# Patient Record
Sex: Male | Born: 1970 | Race: Black or African American | Hispanic: No | Marital: Single | Smoking: Current every day smoker
Health system: Southern US, Community
[De-identification: ages and names within clinical notes are randomized; demographics above are authoritative.]

## PROBLEM LIST (undated history)

## (undated) DIAGNOSIS — E119 Type 2 diabetes mellitus without complications: Secondary | ICD-10-CM

## (undated) HISTORY — PX: OTHER SURGICAL HISTORY: SHX169

## (undated) HISTORY — PX: BRAIN SURGERY: SHX531

---

## 2013-05-01 ENCOUNTER — Emergency Department (HOSPITAL_COMMUNITY): Payer: No Typology Code available for payment source

## 2013-05-01 ENCOUNTER — Encounter (HOSPITAL_COMMUNITY): Payer: Self-pay | Admitting: Emergency Medicine

## 2013-05-01 ENCOUNTER — Emergency Department (HOSPITAL_COMMUNITY)
Admission: EM | Admit: 2013-05-01 | Discharge: 2013-05-01 | Disposition: A | Discharge status: Home / Self Care | Payer: No Typology Code available for payment source | Attending: Emergency Medicine | Admitting: Emergency Medicine

## 2013-05-01 DIAGNOSIS — S139XXA Sprain of joints and ligaments of unspecified parts of neck, initial encounter: Secondary | ICD-10-CM | POA: Insufficient documentation

## 2013-05-01 DIAGNOSIS — S0990XA Unspecified injury of head, initial encounter: Secondary | ICD-10-CM | POA: Insufficient documentation

## 2013-05-01 DIAGNOSIS — R42 Dizziness and giddiness: Secondary | ICD-10-CM | POA: Insufficient documentation

## 2013-05-01 DIAGNOSIS — S161XXA Strain of muscle, fascia and tendon at neck level, initial encounter: Secondary | ICD-10-CM

## 2013-05-01 DIAGNOSIS — Z9889 Other specified postprocedural states: Secondary | ICD-10-CM | POA: Insufficient documentation

## 2013-05-01 DIAGNOSIS — F17200 Nicotine dependence, unspecified, uncomplicated: Secondary | ICD-10-CM | POA: Insufficient documentation

## 2013-05-01 DIAGNOSIS — M549 Dorsalgia, unspecified: Secondary | ICD-10-CM

## 2013-05-01 DIAGNOSIS — Y9389 Activity, other specified: Secondary | ICD-10-CM | POA: Insufficient documentation

## 2013-05-01 DIAGNOSIS — E119 Type 2 diabetes mellitus without complications: Secondary | ICD-10-CM | POA: Insufficient documentation

## 2013-05-01 DIAGNOSIS — IMO0002 Reserved for concepts with insufficient information to code with codable children: Secondary | ICD-10-CM | POA: Insufficient documentation

## 2013-05-01 DIAGNOSIS — Y92410 Unspecified street and highway as the place of occurrence of the external cause: Secondary | ICD-10-CM | POA: Insufficient documentation

## 2013-05-01 DIAGNOSIS — Z880 Allergy status to penicillin: Secondary | ICD-10-CM | POA: Insufficient documentation

## 2013-05-01 HISTORY — DX: Type 2 diabetes mellitus without complications: E11.9

## 2013-05-01 MED ORDER — OXYCODONE-ACETAMINOPHEN 5-325 MG PO TABS
1.0000 | ORAL_TABLET | Freq: Once | ORAL | Status: AC
  Administered 2013-05-01: 1 via ORAL
  Filled 2013-05-01: qty 1
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
## 2013-05-01 NOTE — ED Notes (Signed)
Patient ambulated in hallways without difficulty or complaints Patient states that he is ready to go home C-Collar removed by MD
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
## 2013-05-02 ENCOUNTER — Emergency Department (HOSPITAL_COMMUNITY)
Admission: EM | Admit: 2013-05-02 | Discharge: 2013-05-02 | Disposition: A | Discharge status: Home / Self Care | Payer: No Typology Code available for payment source | Attending: Emergency Medicine | Admitting: Emergency Medicine

## 2013-05-02 DIAGNOSIS — M542 Cervicalgia: Secondary | ICD-10-CM | POA: Insufficient documentation

## 2013-05-02 DIAGNOSIS — R51 Headache: Secondary | ICD-10-CM | POA: Insufficient documentation

## 2013-05-02 DIAGNOSIS — M545 Low back pain, unspecified: Secondary | ICD-10-CM | POA: Insufficient documentation

## 2013-05-02 DIAGNOSIS — F17200 Nicotine dependence, unspecified, uncomplicated: Secondary | ICD-10-CM | POA: Insufficient documentation

## 2013-05-02 DIAGNOSIS — Z880 Allergy status to penicillin: Secondary | ICD-10-CM | POA: Insufficient documentation

## 2013-05-02 DIAGNOSIS — G8911 Acute pain due to trauma: Secondary | ICD-10-CM | POA: Insufficient documentation

## 2013-05-02 DIAGNOSIS — E119 Type 2 diabetes mellitus without complications: Secondary | ICD-10-CM | POA: Insufficient documentation

## 2013-05-02 MED ORDER — OXYCODONE-ACETAMINOPHEN 5-325 MG PO TABS: 1.0000 | ORAL_TABLET | Freq: Three times a day (TID) | ORAL | Status: DC | PRN
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
## 2013-05-02 NOTE — Progress Notes (Signed)
[facility] provided pt with a list of primary care resources.  °

## 2013-05-03 NOTE — ED Provider Notes (Signed)
Medical screening examination/treatment/procedure(s) were performed by non-physician practitioner and as supervising physician I was immediately available for consultation/collaboration.  EKG Interpretation   None         Gwyneth Sprout, MD 05/03/13 [phone]
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
## 2014-01-12 IMAGING — CT CT CERVICAL SPINE W/O CM
2 of 4 series · 5 of 14 positions shown, 6 images · non-contrast
Comparison: No priors.

CLINICAL DATA: Head and neck pain.

EXAM:
CT HEAD WITHOUT CONTRAST
CT CERVICAL SPINE WITHOUT CONTRAST
TECHNIQUE: Multidetector CT imaging of the head and cervical spine was
performed following the standard protocol without intravenous
contrast. Multiplanar CT image reconstructions of the cervical spine
were also generated.

[Series 3: c-spine st · axial · 0.29mm/px · z∈[+1058,+1114]mm · 2 of 86 slices shown]
[im 29/86  bone]
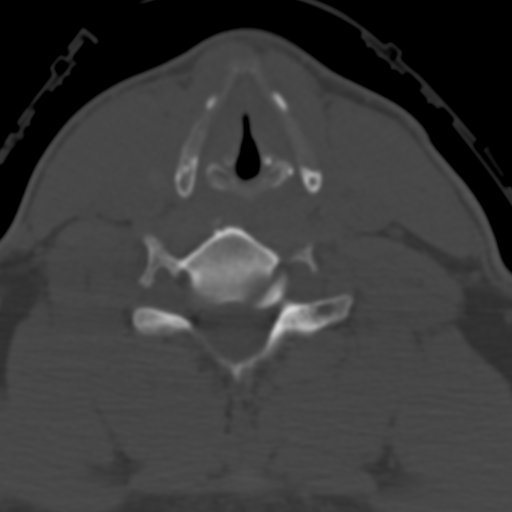
[im 57/86  bone]
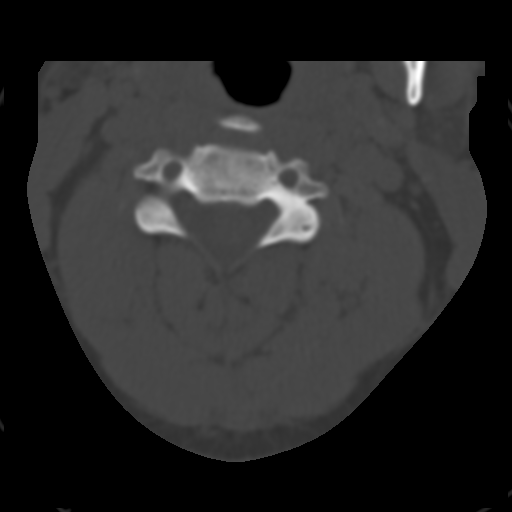

[Series 7: axial · axial · 0.23mm/px · z∈[+1026,+1112]mm · 3 of 90 slices shown, 4 images]
[im 23/90  soft-tissue]
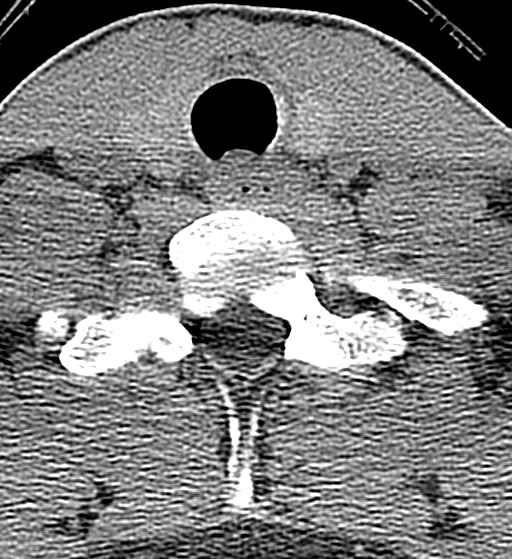
[im 23/90  bone]
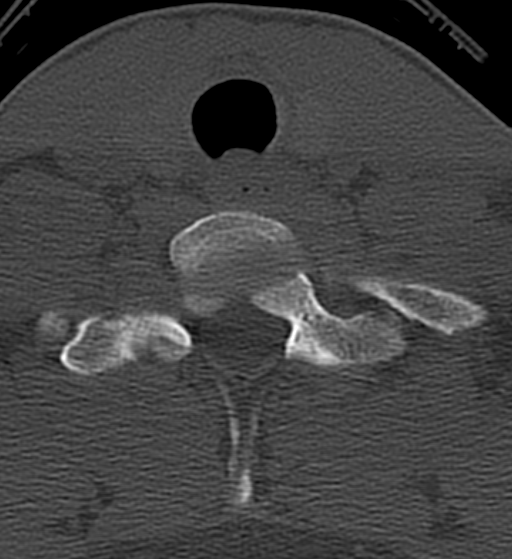
[im 45/90  bone]
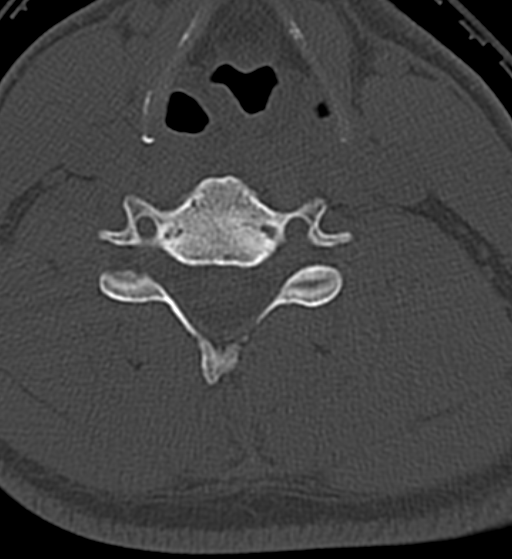
[im 67/90  bone]
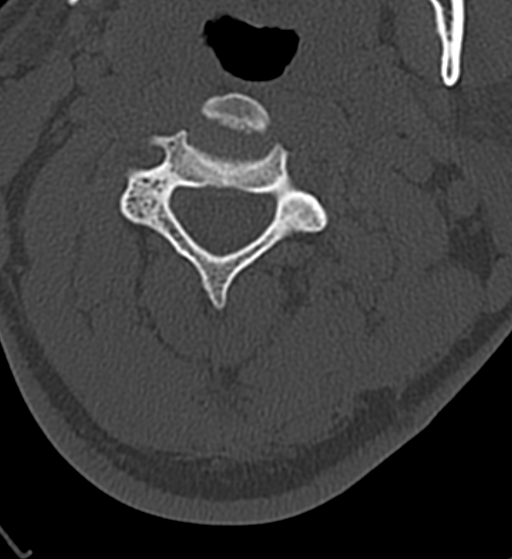

[5 of 14 positions shown; findings below may reference images not displayed]

FINDINGS: CT HEAD FINDINGS

Postoperative changes of left pararenal craniectomy and left
suboccipital craniectomy. Areas of cortical atrophy and underlying
low attenuation are noted in the left frontotemporal region and left
occipital lobe, most compatible with encephalomalacia. No acute
intracranial abnormalities. Specifically, no evidence of acute
intracranial hemorrhage, no definite findings of acute/subacute
cerebral ischemia, no mass, mass effect, hydrocephalus or abnormal
intra or extra-axial fluid collections. Visualized paranasal sinuses
and mastoids are well pneumatized. No acute displaced skull
fractures are identified.

CT CERVICAL SPINE FINDINGS

No acute displaced fractures of the cervical spine. There is
reversal of normal cervical lordosis, centered at the level of C5,
presumably positional. Alignment is otherwise anatomic. Mild
multilevel degenerative disc disease, most pronounced at C5-C6.
Postoperative changes of left suboccipital craniectomy. Visualized
portions of the upper thorax are unremarkable.
IMPRESSION: 1. No acute intracranial abnormalities.
2. Postoperative changes in the head and brain, as above.
3. No evidence of significant acute traumatic injury to the cervical
spine.

## 2014-01-28 ENCOUNTER — Emergency Department (HOSPITAL_COMMUNITY): Payer: Self-pay

## 2014-01-28 ENCOUNTER — Encounter (HOSPITAL_COMMUNITY): Payer: Self-pay | Admitting: Emergency Medicine

## 2014-01-28 ENCOUNTER — Emergency Department (HOSPITAL_COMMUNITY)
Admission: EM | Admit: 2014-01-28 | Discharge: 2014-01-28 | Disposition: A | Discharge status: Home / Self Care | Payer: Self-pay | Attending: Emergency Medicine | Admitting: Emergency Medicine

## 2014-01-28 DIAGNOSIS — S31809A Unspecified open wound of unspecified buttock, initial encounter: Secondary | ICD-10-CM | POA: Insufficient documentation

## 2014-01-28 DIAGNOSIS — W3400XA Accidental discharge from unspecified firearms or gun, initial encounter: Secondary | ICD-10-CM | POA: Insufficient documentation

## 2014-01-28 DIAGNOSIS — E119 Type 2 diabetes mellitus without complications: Secondary | ICD-10-CM | POA: Insufficient documentation

## 2014-01-28 DIAGNOSIS — Y9389 Activity, other specified: Secondary | ICD-10-CM | POA: Insufficient documentation

## 2014-01-28 DIAGNOSIS — Z23 Encounter for immunization: Secondary | ICD-10-CM | POA: Insufficient documentation

## 2014-01-28 DIAGNOSIS — Y9289 Other specified places as the place of occurrence of the external cause: Secondary | ICD-10-CM | POA: Insufficient documentation

## 2014-01-28 DIAGNOSIS — F17200 Nicotine dependence, unspecified, uncomplicated: Secondary | ICD-10-CM | POA: Insufficient documentation

## 2014-01-28 DIAGNOSIS — S31813A Puncture wound without foreign body of right buttock, initial encounter: Secondary | ICD-10-CM

## 2014-01-28 LAB — PREPARE FRESH FROZEN PLASMA
UNIT DIVISION: 0
Unit division: 0

## 2014-01-28 MED ORDER — SODIUM CHLORIDE 0.9 % IJ SOLN
10.0000 mL | INTRAMUSCULAR | Status: DC | PRN
  Administered 2014-01-28: 10 mL via INTRAVENOUS

## 2014-01-28 MED ORDER — MORPHINE SULFATE 2 MG/ML IJ SOLN
INTRAMUSCULAR | Status: AC
  Filled 2014-01-28: qty 2

## 2014-01-28 MED ORDER — OXYCODONE-ACETAMINOPHEN 5-325 MG PO TABS: 1.0000 | ORAL_TABLET | ORAL | Status: DC | PRN

## 2014-01-28 MED ORDER — CLINDAMYCIN HCL 150 MG PO CAPS: 150.0000 mg | ORAL_CAPSULE | Freq: Four times a day (QID) | ORAL | Status: AC

## 2014-01-28 MED ORDER — FENTANYL CITRATE 0.05 MG/ML IJ SOLN
50.0000 ug | Freq: Once | INTRAMUSCULAR | Status: AC
  Administered 2014-01-28: 50 ug via INTRAVENOUS

## 2014-01-28 MED ORDER — FENTANYL CITRATE 0.05 MG/ML IJ SOLN
INTRAMUSCULAR | Status: AC
  Filled 2014-01-28: qty 2

## 2014-01-28 MED ORDER — MORPHINE SULFATE 4 MG/ML IJ SOLN
4.0000 mg | Freq: Once | INTRAMUSCULAR | Status: AC
Start: 2014-01-28 — End: 2014-01-28
  Administered 2014-01-28: 4 mg via INTRAVENOUS
  Filled 2014-01-28: qty 1

## 2014-01-28 MED ORDER — OXYCODONE-ACETAMINOPHEN 5-325 MG PO TABS
1.0000 | ORAL_TABLET | Freq: Once | ORAL | Status: AC
  Administered 2014-01-28: 1 via ORAL
  Filled 2014-01-28: qty 1

## 2014-01-28 MED ORDER — TETANUS-DIPHTH-ACELL PERTUSSIS 5-2.5-18.5 LF-MCG/0.5 IM SUSP
0.5000 mL | Freq: Once | INTRAMUSCULAR | Status: AC
  Administered 2014-01-28: 0.5 mL via INTRAMUSCULAR
  Filled 2014-01-28: qty 0.5

## 2014-01-28 MED ORDER — CLINDAMYCIN PHOSPHATE 600 MG/50ML IV SOLN
600.0000 mg | Freq: Once | INTRAVENOUS | Status: AC
Start: 2014-01-28 — End: 2014-01-28
  Administered 2014-01-28: 600 mg via INTRAVENOUS
  Filled 2014-01-28: qty 50
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
## 2014-01-28 NOTE — ED Notes (Signed)
Spoke with Dr. Bebe ShaggyWickline about giving pt percocet before d/c
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
## 2014-01-28 NOTE — ED Notes (Signed)
Pt ambulated independently. NAD at this time.
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
## 2014-01-28 NOTE — Discharge Instructions (Signed)
Gunshot Wound °Gunshot wounds can cause severe bleeding, damage to soft tissues and vital organs, and broken bones (fractures). They can also lead to infection. The amount of damage depends on the location of the injury, the type of bullet, and how deep the bullet penetrated the body.  °DIAGNOSIS  °A gunshot wound is usually diagnosed by your history and a physical exam. X-rays, an ultrasound exam, or other imaging studies may be done to check for foreign bodies in the wound and to determine the extent of damage. °TREATMENT °Many times, gunshot wounds can be treated by cleaning the wound area and bullet tract and applying a sterile bandage (dressing). Stitches (sutures), skin adhesive strips, or staples may be used to close some wounds. If the injury includes a fracture, a splint may be applied to prevent movement. Antibiotic treatment may be prescribed to help prevent infection. Depending on the gunshot wound and its location, you may require surgery. This is especially true for many bullet injuries to the chest, back, abdomen, and neck. Gunshot wounds to these areas require immediate medical care. °Although there may be lead bullet fragments left in your wound, this will not cause lead poisoning. Bullets or bullet fragments are not removed if they are not causing problems. Removing them could cause more damage to the surrounding tissue. If the bullets or fragments are not very deep, they might work their way closer to the surface of the skin. This might take weeks or even years. Then, they can be removed after applying medicine that numbs the area (local anesthetic). °HOME CARE INSTRUCTIONS  °· Rest the injured body part for the next 2-3 days or as directed by your health care provider. °· If possible, keep the injured area elevated to reduce pain and swelling. °· Keep the area clean and dry. Remove or change any dressings as instructed by your health care provider. °· Only take over-the-counter or prescription  medicines as directed by your health care provider. °· If antibiotics were prescribed, take them as directed. Finish them even if you start to feel better. °· Keep all follow-up appointments. A follow-up exam is usually needed to recheck the injury within 2-3 days. °SEEK IMMEDIATE MEDICAL CARE IF: °· You have shortness of breath. °· You have severe chest or abdominal pain. °· You pass out (faint) or feel as if you may pass out. °· You have uncontrolled bleeding. °· You have chills or a fever. °· You have nausea or vomiting. °· You have redness, swelling, increasing pain, or drainage of pus at the site of the wound. °· You have numbness or weakness in the injured area. This may be a sign of damage to an underlying nerve or tendon. °MAKE SURE YOU:  °· Understand these instructions. °· Will watch your condition. °· Will get help right away if you are not doing well or get worse. °Document Released: 07/24/2004 Document Revised: 04/06/2013 Document Reviewed: 02/21/2013 °ExitCare® Patient Information ©2015 [employer]. This information is not intended to replace advice given to you by your health care provider. Make sure you discuss any questions you have with your health care provider. ° °

## 2014-01-30 ENCOUNTER — Encounter (HOSPITAL_COMMUNITY): Payer: Self-pay | Admitting: Emergency Medicine

## 2014-02-02 ENCOUNTER — Telehealth (HOSPITAL_COMMUNITY): Payer: Self-pay

## 2014-02-03 NOTE — Telephone Encounter (Signed)
Patient wasn't sure he could make appt on 8/12 @2 :45. He will call back if he can make it. Alternatively I told him he could seek care at an urgent care.

## 2014-02-04 ENCOUNTER — Emergency Department (INDEPENDENT_AMBULATORY_CARE_PROVIDER_SITE_OTHER): Payer: Self-pay

## 2014-02-04 ENCOUNTER — Encounter (HOSPITAL_COMMUNITY): Payer: Self-pay | Admitting: Emergency Medicine

## 2014-02-04 ENCOUNTER — Emergency Department (INDEPENDENT_AMBULATORY_CARE_PROVIDER_SITE_OTHER)
Admission: EM | Admit: 2014-02-04 | Discharge: 2014-02-04 | Disposition: A | Discharge status: Home / Self Care | Payer: Self-pay | Source: Home / Self Care | Attending: Family Medicine | Admitting: Family Medicine

## 2014-02-04 DIAGNOSIS — T148XXA Other injury of unspecified body region, initial encounter: Secondary | ICD-10-CM

## 2014-02-04 DIAGNOSIS — W3400XA Accidental discharge from unspecified firearms or gun, initial encounter: Secondary | ICD-10-CM

## 2014-02-04 MED ORDER — TRAMADOL HCL 50 MG PO TABS: 50.0000 mg | ORAL_TABLET | Freq: Four times a day (QID) | ORAL | Status: AC | PRN
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
## 2014-02-08 ENCOUNTER — Encounter (INDEPENDENT_AMBULATORY_CARE_PROVIDER_SITE_OTHER): Payer: Self-pay

## 2014-02-08 ENCOUNTER — Encounter (INDEPENDENT_AMBULATORY_CARE_PROVIDER_SITE_OTHER): Payer: Self-pay | Admitting: *Deleted
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
## 2016-10-31 ENCOUNTER — Emergency Department (HOSPITAL_COMMUNITY)
Admission: EM | Admit: 2016-10-31 | Discharge: 2016-10-31 | Disposition: A | Discharge status: Home / Self Care | Payer: Self-pay | Attending: Emergency Medicine | Admitting: Emergency Medicine

## 2016-10-31 ENCOUNTER — Emergency Department (HOSPITAL_COMMUNITY): Payer: Self-pay

## 2016-10-31 ENCOUNTER — Encounter (HOSPITAL_COMMUNITY): Payer: Self-pay | Admitting: *Deleted

## 2016-10-31 DIAGNOSIS — F17210 Nicotine dependence, cigarettes, uncomplicated: Secondary | ICD-10-CM | POA: Insufficient documentation

## 2016-10-31 DIAGNOSIS — M25511 Pain in right shoulder: Secondary | ICD-10-CM | POA: Insufficient documentation

## 2016-10-31 DIAGNOSIS — E119 Type 2 diabetes mellitus without complications: Secondary | ICD-10-CM | POA: Insufficient documentation

## 2016-10-31 DIAGNOSIS — Z79899 Other long term (current) drug therapy: Secondary | ICD-10-CM | POA: Insufficient documentation

## 2016-10-31 MED ORDER — KETOROLAC TROMETHAMINE 30 MG/ML IJ SOLN
30.0000 mg | Freq: Once | INTRAMUSCULAR | Status: AC
  Administered 2016-10-31: 30 mg via INTRAMUSCULAR
  Filled 2016-10-31: qty 1

## 2016-10-31 MED ORDER — METHOCARBAMOL 500 MG PO TABS: 500.0000 mg | ORAL_TABLET | Freq: Two times a day (BID) | ORAL | 0 refills | Status: AC

## 2016-10-31 MED ORDER — KETOROLAC TROMETHAMINE 30 MG/ML IJ SOLN: 30.0000 mg | Freq: Once | INTRAMUSCULAR | Status: DC

## 2016-10-31 MED ORDER — NAPROXEN 500 MG PO TABS: 500.0000 mg | ORAL_TABLET | Freq: Two times a day (BID) | ORAL | 0 refills | Status: AC

## 2016-10-31 NOTE — Discharge Instructions (Signed)
You can take Naproxen for pain.  Take Robaxin as directed to help with the pain.   Follow-up with referred orthopedic doctor for further evaluation. Call his office and arrange to be seen next few days.  Return to the emergency department for any worsening pain, redness, swelling fevers or any other worsening or concerning symptoms.  .Marland Kitchen

## 2016-10-31 NOTE — ED Provider Notes (Signed)
[facility]-EMERGENCY DEPT Provider Note   CSN: [id number] Arrival date & time: 10/31/16  1335  By signing my name below, I, Linna Darner, attest that this documentation has been prepared under the direction and in the presence of Graciella Freer, PA-C. Electronically Signed: Linna Darner, Scribe. 10/31/2016. 3:31 PM.  History   Chief Complaint Chief Complaint  Patient presents with  . Shoulder Pain   The history is provided by the patient. No language interpreter was used.    HPI Comments: Tyler Reese is a 46 y.o. male with PMHx of DM who presents to the Emergency Department complaining of constant, gradually worsening, right shoulder pain radiating down his right arm for two weeks. No recent trauma or injury to his right upper extremity. He states his pain is worse with movement of his right arm as well as with applied pressure to his right shoulder. He has been taking ibuprofen regularly without any improvement of his pain. No ice or heat therapy tried. No h/o right shoulder injuries. 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 He denies fevers, chills, nausea, vomiting, rashes, numbness/tingling, focal weakness, or any other associated symptoms.  Past Medical History:  Diagnosis Date  . Diabetes mellitus without complication (HCC)     There are no active problems to display for this patient.   Past Surgical History:  Procedure Laterality Date  . BRAIN SURGERY    . head surgery          Home Medications    Prior to Admission medications   Medication Sig Start Date End Date Taking? Authorizing Provider  clindamycin (CLEOCIN) 150 MG capsule Take 1 capsule (150 mg total) by mouth every 6 (six) hours. 01/28/14   Zadie Rhine, MD  ibuprofen (ADVIL,MOTRIN) 200 MG tablet Take 400 mg by mouth 2 (two) times daily as needed for pain.    Historical Provider, MD  methocarbamol (ROBAXIN) 500 MG tablet Take 1  tablet (500 mg total) by mouth 2 (two) times daily. 10/31/16   Maxwell Caul, PA-C  naproxen (NAPROSYN) 500 MG tablet Take 1 tablet (500 mg total) by mouth 2 (two) times daily. 10/31/16   Maxwell Caul, PA-C  traMADol (ULTRAM) 50 MG tablet Take 1 tablet (50 mg total) by mouth every 6 (six) hours as needed. 02/04/14   Rodolph Bong, MD    Family History Family History  Problem Relation Age of Onset  . Cancer Mother   . Hypertension Mother   . Diabetes Mother   . Hypertension Father     Social History Social History  Substance Use Topics  . Smoking status: Current Every Day Smoker    Packs/day: 0.50    Types: Cigarettes  . Smokeless tobacco: Not on file  . Alcohol use Yes     Comment: social      Allergies   Penicillins and Penicillins   Review of Systems Review of Systems  Constitutional: Negative for chills and fever.  Gastrointestinal: Negative for nausea and vomiting.  Musculoskeletal: Positive for myalgias.  Skin: Negative for rash.  Neurological: Negative for weakness and numbness.  All other systems reviewed and are negative.  Physical Exam Updated Vital Signs BP 113/71 (BP Location: Right Arm)   Pulse 66   Temp 98 F (36.7 C)   Resp 18   SpO2 100%   Physical Exam  Constitutional: He appears well-developed and well-nourished.  HENT:  Head: Normocephalic and atraumatic.  Eyes: Conjunctivae and EOM are normal. Right eye exhibits no discharge. Left eye exhibits no discharge. No scleral icterus.  Neck: Full passive range of motion without pain. Muscular tenderness present. No spinous process tenderness present.  Negative Spurling's bilaterally. Diffuse paraspinal tenderness to the right cervical region overlying the right trapezius.  Pulmonary/Chest: Effort normal.  Musculoskeletal: He exhibits no deformity.  Full abduction and abduction of right upper extremity without difficulty. Strength of right shoulder intact. Tenderness to palpation to the right  scapular region underlying the trapezius. No deformity or crepitus noted.Negative Hawkins Test, Neer's Test, Empty Can Test, and Lift-Off Test.   Neurological: He is alert.  5/5 strength bilateral upper extremities. Sensation intact throughout.  Skin: Skin is warm and dry. Capillary refill takes less than 2 seconds. No rash noted.  No rash, warmth, or erythema noted to right shoulder.  Psychiatric: He has a normal mood and affect. His speech is normal and behavior is normal.   ED Treatments / Results  Labs (all labs ordered are listed, but only abnormal results are displayed) Labs Reviewed - No data to display  EKG  EKG Interpretation None       Radiology Dg Shoulder Right  Result Date: 10/31/2016 CLINICAL DATA:  Worsening right shoulder pain x2 weeks without known injury EXAM: RIGHT SHOULDER - 2+ VIEW COMPARISON:  None. FINDINGS: There is no evidence of fracture or dislocation. A normal variant os acromiale is seen on the axillary view. 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 Soft tissues are unremarkable. The visualized adjacent ribs and lung are nonacute. IMPRESSION: 1. Normal variant os acromiale. 2. Undersurface spurring is noted at the level of the [facility] joint. 3. No acute fracture nor dislocations. Electronically Signed   By: Tollie Eth M.D.   On: 10/31/2016 16:51    Procedures Procedures (including critical care time)  DIAGNOSTIC STUDIES: Oxygen Saturation is 98% on RA, normal by my interpretation.    COORDINATION OF CARE: 3:39 PM Discussed treatment plan with pt at bedside and pt agreed to plan.  Medications Ordered in ED Medications  ketorolac (TORADOL) 30 MG/ML injection 30 mg (30 mg Intramuscular Given 10/31/16 1618)     Initial Impression / Assessment and Plan / ED Course  I have reviewed the triage vital signs and the nursing notes.  Pertinent labs & imaging results that were available during my care of the patient were reviewed by  me and considered in my medical decision making (see chart for details).    46 year old male who presents with 2 weeks of worsening right shoulder pain. No history of trauma or injury. History of rash or fever. He recently started a new job and reports that he does a lot of heavy lifting right now since he is being trained. Physical exam with no evidence of rash, overlying erythema, warmth. He's got full abduction/adduction of bilateral upper extremities. Negative Hawkins, Neer's impingement, empty can test, lift off test. Concern for sprain versus muscular strain, especially given tenderness over the trapezius. Consider rotator cuff injury though physical exam with negative testing. Will obtain x-ray for further evaluation and rule out fracture. Analgesics given in the department.  Patient reports improvement after Toradol. X-ray negative for any fracture. Discussed results with patient. Will plan to send him home with symptomatic relief. Instructed him to follow up with orthopedic surgeon for further evaluation. Strict return precautions discussed. Patient expresses understanding and agreement to plan.  Final Clinical Impressions(s) / ED Diagnoses   Final diagnoses:  Acute pain of right shoulder    New Prescriptions Discharge Medication List as of 10/31/2016  5:25 PM    START taking these medications   Details  methocarbamol (ROBAXIN) 500 MG tablet Take 1 tablet (500 mg total) by mouth 2 (two) times daily., Starting Fri 10/31/2016, Print    naproxen (NAPROSYN) 500 MG tablet Take 1 tablet (500 mg total) by mouth 2 (two) times daily., Starting Fri 10/31/2016, Print       I personally performed the services described in this documentation, which was scribed in my presence. The recorded information has been reviewed and is accurate.    Maxwell CaulLayden, Bertran Zeimet A, PA-C 11/01/16 1631    Nira Connardama, Pedro Eduardo, MD 11/04/16 [id number]

## 2016-10-31 NOTE — ED Triage Notes (Signed)
Pt reports right shoulder pain for weeks, increases with certain movements. No acute distress noted at triage.

## 2016-10-31 NOTE — ED Notes (Signed)
Pt /o shoulder pain X 2 weeks reporting he can not lay on it or put any pressure on it. Denies trauma.

## 2017-07-14 IMAGING — CR DG SHOULDER 2+V*R*
3 series · 3 of 3 positions shown · non-contrast
Comparison: None.

CLINICAL DATA: Worsening right shoulder pain x2 weeks without known
injury

EXAM:
RIGHT SHOULDER - 2+ VIEW

[shoulder grashey]
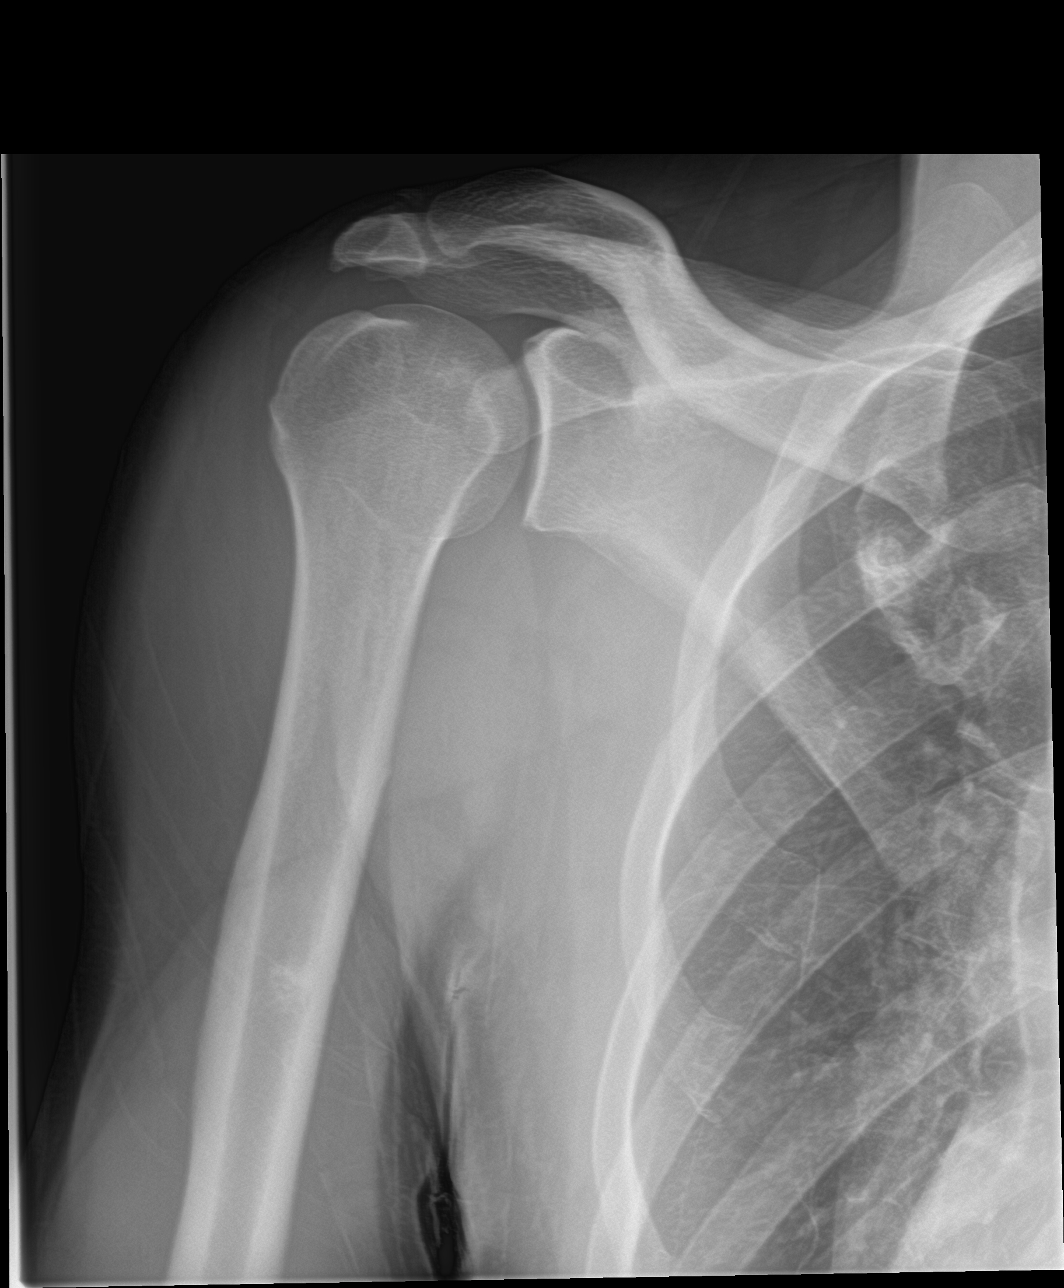

[shoulder y view]
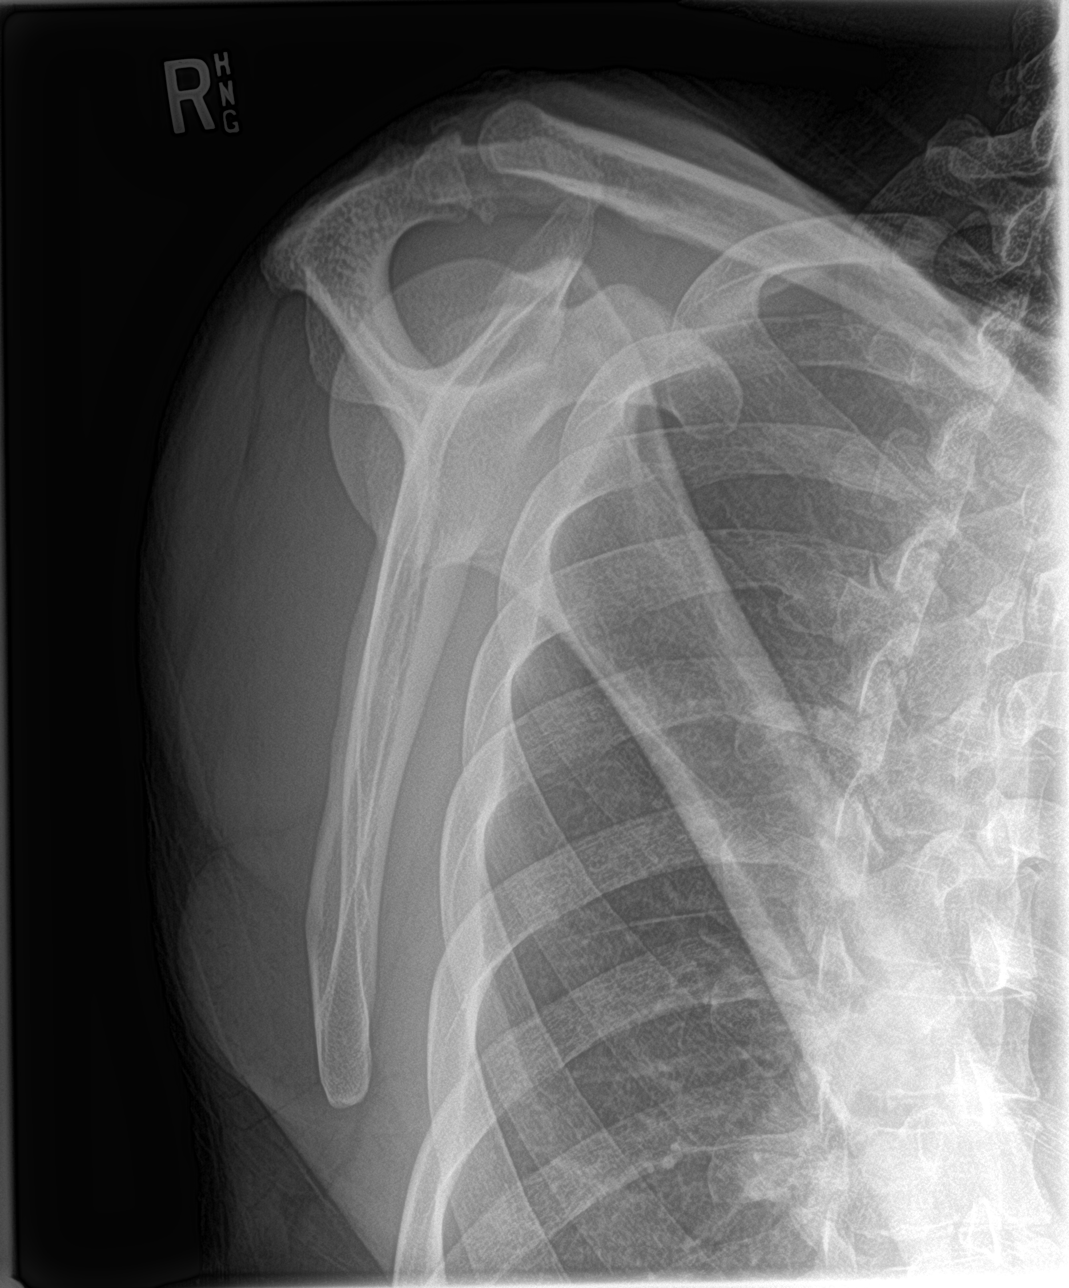

[shoulder axillary]
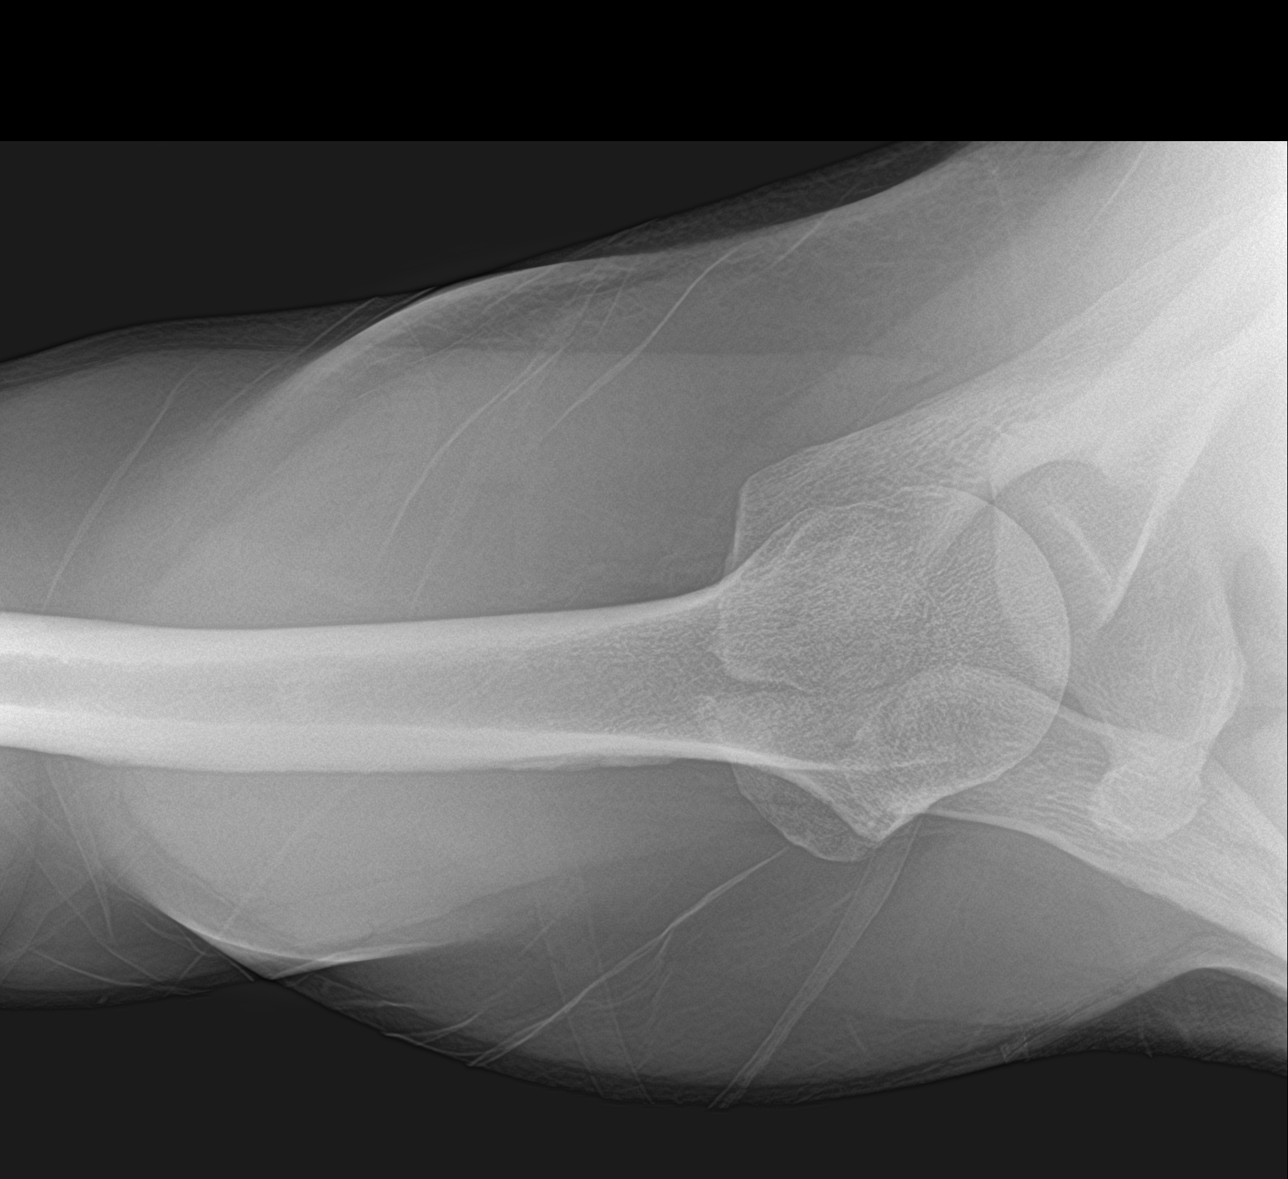

[3 of 3 positions shown; findings below may reference images not displayed]

FINDINGS: There is no evidence of fracture or dislocation. A normal variant os
acromiale is seen on the axillary view. AC joint undersurface
spurring consistent with osteoarthritis is noted. Soft tissues are
unremarkable. The visualized adjacent ribs and lung are nonacute.
IMPRESSION: 1. Normal variant os acromiale.
2. Undersurface spurring is noted at the level of the AC joint.
3. No acute fracture nor dislocations.
